# Patient Record
Sex: Female | Born: 2005 | Race: White | Hispanic: No | Marital: Single | Smoking: Never smoker
Health system: Southern US, Community
[De-identification: ages and names within clinical notes are randomized; demographics above are authoritative.]

## PROBLEM LIST (undated history)

## (undated) DIAGNOSIS — F909 Attention-deficit hyperactivity disorder, unspecified type: Secondary | ICD-10-CM

## (undated) DIAGNOSIS — H6690 Otitis media, unspecified, unspecified ear: Secondary | ICD-10-CM

## (undated) DIAGNOSIS — F3281 Premenstrual dysphoric disorder: Secondary | ICD-10-CM

## (undated) DIAGNOSIS — F329 Major depressive disorder, single episode, unspecified: Secondary | ICD-10-CM

## (undated) DIAGNOSIS — F32A Depression, unspecified: Secondary | ICD-10-CM

## (undated) DIAGNOSIS — R519 Headache, unspecified: Secondary | ICD-10-CM

## (undated) HISTORY — PX: TOE SURGERY: SHX1073

## (undated) HISTORY — PX: TYMPANOPLASTY: SHX33

## (undated) HISTORY — PX: OTHER SURGICAL HISTORY: SHX169

---
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
## 2024-02-22 ENCOUNTER — Encounter: Payer: Self-pay | Admitting: Nurse Practitioner

## 2024-02-22 ENCOUNTER — Ambulatory Visit (INDEPENDENT_AMBULATORY_CARE_PROVIDER_SITE_OTHER): Admitting: Nurse Practitioner

## 2024-02-22 VITALS — BP 117/69 | HR 81 | Temp 97.3°F | Ht 63.0 in | Wt 183.6 lb

## 2024-02-22 DIAGNOSIS — Z6832 Body mass index (BMI) 32.0-32.9, adult: Secondary | ICD-10-CM | POA: Diagnosis not present

## 2024-02-22 DIAGNOSIS — L918 Other hypertrophic disorders of the skin: Secondary | ICD-10-CM

## 2024-02-22 DIAGNOSIS — E66811 Obesity, class 1: Secondary | ICD-10-CM

## 2024-02-22 DIAGNOSIS — Z23 Encounter for immunization: Secondary | ICD-10-CM

## 2024-02-22 DIAGNOSIS — Z0001 Encounter for general adult medical examination with abnormal findings: Secondary | ICD-10-CM | POA: Insufficient documentation

## 2024-02-22 DIAGNOSIS — M79676 Pain in unspecified toe(s): Secondary | ICD-10-CM | POA: Diagnosis not present

## 2024-02-22 DIAGNOSIS — E6609 Other obesity due to excess calories: Secondary | ICD-10-CM | POA: Insufficient documentation

## 2024-02-22 LAB — BAYER DCA HB A1C WAIVED: HB A1C (BAYER DCA - WAIVED): 4.8 % (ref 4.8–5.6)

## 2024-02-22 NOTE — Addendum Note (Signed)
 Addended by: JODENE CORNERS B on: 02/22/2024 04:18 PM   Modules accepted: Orders

## 2024-02-23 ENCOUNTER — Ambulatory Visit: Payer: Self-pay | Admitting: Nurse Practitioner

## 2024-02-23 LAB — CBC WITH DIFFERENTIAL/PLATELET
Basophils Absolute: 0 x10E3/uL (ref 0.0–0.2)
Basos: 0 %
EOS (ABSOLUTE): 0.1 x10E3/uL (ref 0.0–0.4)
Eos: 1 %
Hematocrit: 42.2 % (ref 34.0–46.6)
Hemoglobin: 13.8 g/dL (ref 11.1–15.9)
Immature Grans (Abs): 0 x10E3/uL (ref 0.0–0.1)
Immature Granulocytes: 0 %
Lymphocytes Absolute: 2.1 x10E3/uL (ref 0.7–3.1)
Lymphs: 25 %
MCH: 27.6 pg (ref 26.6–33.0)
MCHC: 32.7 g/dL (ref 31.5–35.7)
MCV: 84 fL (ref 79–97)
Monocytes Absolute: 0.5 x10E3/uL (ref 0.1–0.9)
Monocytes: 5 %
Neutrophils Absolute: 5.9 x10E3/uL (ref 1.4–7.0)
Neutrophils: 69 %
Platelets: 354 x10E3/uL (ref 150–450)
RBC: 5 x10E6/uL (ref 3.77–5.28)
RDW: 13.7 % (ref 11.7–15.4)
WBC: 8.7 x10E3/uL (ref 3.4–10.8)

## 2024-02-23 LAB — LIPID PANEL
Chol/HDL Ratio: 5.4 ratio — ABNORMAL HIGH (ref 0.0–4.4)
Cholesterol, Total: 156 mg/dL (ref 100–169)
HDL: 29 mg/dL — ABNORMAL LOW (ref >39)
LDL Chol Calc (NIH): 98 mg/dL (ref 0–109)
Triglycerides: 166 mg/dL — ABNORMAL HIGH (ref 0–89)
VLDL Cholesterol Cal: 29 mg/dL (ref 5–40)

## 2024-02-23 LAB — CMP14+EGFR
ALT: 41 IU/L — ABNORMAL HIGH (ref 0–32)
AST: 21 IU/L (ref 0–40)
Albumin: 5 g/dL (ref 4.0–5.0)
Alkaline Phosphatase: 84 IU/L (ref 42–106)
BUN/Creatinine Ratio: 21 (ref 9–23)
BUN: 15 mg/dL (ref 6–20)
Bilirubin Total: 0.3 mg/dL (ref 0.0–1.2)
CO2: 22 mmol/L (ref 20–29)
Calcium: 10.2 mg/dL (ref 8.7–10.2)
Chloride: 105 mmol/L (ref 96–106)
Creatinine, Ser: 0.71 mg/dL (ref 0.57–1.00)
Globulin, Total: 2 g/dL (ref 1.5–4.5)
Glucose: 77 mg/dL (ref 70–99)
Potassium: 4.6 mmol/L (ref 3.5–5.2)
Sodium: 144 mmol/L (ref 134–144)
Total Protein: 7 g/dL (ref 6.0–8.5)
eGFR: 126 mL/min/1.73 (ref >59)

## 2024-02-23 LAB — THYROID PANEL WITH TSH
Free Thyroxine Index: 2.2 (ref 1.2–4.9)
T3 Uptake Ratio: 25 % (ref 24–39)
T4, Total: 8.8 ug/dL (ref 4.5–12.0)
TSH: 0.67 u[IU]/mL (ref 0.450–4.500)

## 2024-02-23 LAB — HIV ANTIBODY (ROUTINE TESTING W REFLEX): HIV Screen 4th Generation wRfx: NONREACTIVE

## 2024-03-21 ENCOUNTER — Emergency Department (HOSPITAL_BASED_OUTPATIENT_CLINIC_OR_DEPARTMENT_OTHER)
Admission: EM | Admit: 2024-03-21 | Discharge: 2024-03-21 | Disposition: A | Discharge status: Home / Self Care | Source: Home / Self Care | Attending: Emergency Medicine | Admitting: Emergency Medicine

## 2024-03-21 ENCOUNTER — Encounter (HOSPITAL_BASED_OUTPATIENT_CLINIC_OR_DEPARTMENT_OTHER): Payer: Self-pay | Admitting: *Deleted

## 2024-03-21 ENCOUNTER — Other Ambulatory Visit: Payer: Self-pay

## 2024-03-21 ENCOUNTER — Emergency Department (HOSPITAL_BASED_OUTPATIENT_CLINIC_OR_DEPARTMENT_OTHER)

## 2024-03-21 DIAGNOSIS — Z91040 Latex allergy status: Secondary | ICD-10-CM | POA: Insufficient documentation

## 2024-03-21 DIAGNOSIS — D72829 Elevated white blood cell count, unspecified: Secondary | ICD-10-CM | POA: Insufficient documentation

## 2024-03-21 DIAGNOSIS — N3001 Acute cystitis with hematuria: Secondary | ICD-10-CM | POA: Insufficient documentation

## 2024-03-21 DIAGNOSIS — R1031 Right lower quadrant pain: Secondary | ICD-10-CM

## 2024-03-21 LAB — URINALYSIS, ROUTINE W REFLEX MICROSCOPIC
Bacteria, UA: NONE SEEN
Bilirubin Urine: NEGATIVE
Glucose, UA: NEGATIVE mg/dL
Ketones, ur: 15 mg/dL — AB
Nitrite: NEGATIVE
Protein, ur: 30 mg/dL — AB
RBC / HPF: >50 RBC/hpf (ref 0–5)
Specific Gravity, Urine: 1.026 (ref 1.005–1.030)
WBC, UA: >50 WBC/hpf (ref 0–5)
pH: 6.5 (ref 5.0–8.0)

## 2024-03-21 LAB — LIPASE, BLOOD: Lipase: 19 U/L (ref 11–51)

## 2024-03-21 LAB — CBC
HCT: 43.3 % (ref 36.0–46.0)
Hemoglobin: 14.9 g/dL (ref 12.0–15.0)
MCH: 27.6 pg (ref 26.0–34.0)
MCHC: 34.4 g/dL (ref 30.0–36.0)
MCV: 80.3 fL (ref 80.0–100.0)
Platelets: 331 K/uL (ref 150–400)
RBC: 5.39 MIL/uL — ABNORMAL HIGH (ref 3.87–5.11)
RDW: 13.3 % (ref 11.5–15.5)
WBC: 11.2 K/uL — ABNORMAL HIGH (ref 4.0–10.5)
nRBC: 0 % (ref 0.0–0.2)

## 2024-03-21 LAB — COMPREHENSIVE METABOLIC PANEL WITH GFR
ALT: 42 U/L (ref 0–44)
AST: 22 U/L (ref 15–41)
Albumin: 5.1 g/dL — ABNORMAL HIGH (ref 3.5–5.0)
Alkaline Phosphatase: 94 U/L (ref 38–126)
Anion gap: 14 (ref 5–15)
BUN: 15 mg/dL (ref 6–20)
CO2: 24 mmol/L (ref 22–32)
Calcium: 10.3 mg/dL (ref 8.9–10.3)
Chloride: 101 mmol/L (ref 98–111)
Creatinine, Ser: 0.6 mg/dL (ref 0.44–1.00)
GFR, Estimated: >60 mL/min (ref >60)
Glucose, Bld: 85 mg/dL (ref 70–99)
Potassium: 3.9 mmol/L (ref 3.5–5.1)
Sodium: 139 mmol/L (ref 135–145)
Total Bilirubin: 0.4 mg/dL (ref 0.0–1.2)
Total Protein: 8 g/dL (ref 6.5–8.1)

## 2024-03-21 LAB — PREGNANCY, URINE: Preg Test, Ur: NEGATIVE

## 2024-03-21 LAB — OCCULT BLOOD X 1 CARD TO LAB, STOOL: Fecal Occult Bld: NEGATIVE

## 2024-03-21 MED ORDER — CEFPODOXIME PROXETIL 200 MG PO TABS: 200.0000 mg | ORAL_TABLET | Freq: Two times a day (BID) | ORAL | 0 refills | Status: AC

## 2024-03-21 MED ORDER — IOHEXOL 300 MG/ML  SOLN
100.0000 mL | Freq: Once | INTRAMUSCULAR | Status: AC | PRN
  Administered 2024-03-21: 20:00:00 85 mL via INTRAVENOUS

## 2024-03-21 MED ORDER — SODIUM CHLORIDE 0.9 % IV SOLN
1.0000 g | Freq: Once | INTRAVENOUS | Status: AC
  Administered 2024-03-21: 21:00:00 1 g via INTRAVENOUS
  Filled 2024-03-21: qty 10

## 2024-03-21 NOTE — Discharge Instructions (Signed)
 As we discussed, you did have a urinary tract infection based on labs.  All of your other labs and CT scan was normal.  I will prescribe you some antibiotics.  Please take as prescribed.  You can follow-up with your primary care doctor.  You can return to the emergency department for any worsening symptoms.

## 2024-03-21 NOTE — ED Provider Notes (Signed)
 [place] EMERGENCY DEPARTMENT AT [facility] Provider Note   CSN: [id number] Arrival date & time: 03/21/24  1859     Patient presents with: Abdominal Pain and Rectal Bleeding   Vanessa Flores is a 18 y.o. female patient who presents to the emergency department today for further evaluation of right lower quadrant abdominal pain and rectal bleeding that started yesterday.  Patient has had a total of 4 bloody bowel movements.  Abdominal pain started shortly after.  Primary localized to the right side the abdomen worse in the right lower quadrant.  Denies any nausea, vomiting, diarrhea, vaginal symptoms, fever, chills.    Abdominal Pain Associated symptoms: hematochezia   Rectal Bleeding Associated symptoms: abdominal pain        Prior to Admission medications  Medication Sig Start Date End Date Taking? Authorizing Provider  cefpodoxime  (VANTIN ) 200 MG tablet Take 1 tablet (200 mg total) by mouth 2 (two) times daily. 03/21/24  Yes Theotis, Alaycia Eardley M, PA-C  cloNIDine  HCl (KAPVAY ) 0.1 MG TB12 ER tablet Take 1 tablet (0.1 mg total) by mouth 2 (two) times daily in the am and at bedtime.. Patient not taking: Reported on 01/25/2019 07/25/17 01/26/19  Jonnalagadda, Janardhana, MD    Allergies: Adhesive [tape] and Latex    Review of Systems  Gastrointestinal:  Positive for abdominal pain and hematochezia.  All other systems reviewed and are negative.   Updated Vital Signs BP (!) 136/96 (BP Location: Right Arm)   Pulse 85   Temp 98.6 F (37 C)   Resp 19   Ht 5' 3 (1.6 m)   Wt 83 kg   LMP 02/22/2024   SpO2 100%   BMI 32.41 kg/m   Physical Exam Vitals and nursing note reviewed. Exam conducted with a chaperone present.  Constitutional:      General: She is not in acute distress.    Appearance: Normal appearance.  HENT:     Head: Normocephalic and atraumatic.  Eyes:     General:        Right eye: No discharge.        Left eye: No discharge.  Cardiovascular:      Comments: Regular rate and rhythm.  S1/S2 are distinct without any evidence of murmur, rubs, or gallops.  Radial pulses are 2+ bilaterally.  Dorsalis pedis pulses are 2+ bilaterally.  No evidence of pedal edema. Pulmonary:     Comments: Clear to auscultation bilaterally.  Normal effort.  No respiratory distress.  No evidence of wheezes, rales, or rhonchi heard throughout. Abdominal:     General: Abdomen is flat. Bowel sounds are normal. There is no distension.     Tenderness: There is no guarding or rebound.     Comments: There is tenderness to the right side of the abdomen worse in the right lower quadrant in comparison to the right upper quadrant.  No guarding or rebound.  No peritoneal signs.  Genitourinary:    Comments: No evidence of external hemorrhoids on visual inspection.  Digital rectal exam showed good rectal tone.  No evidence of anal fissure or internal hemorrhoids.  Scant stool that was brown in the rectal vault. Musculoskeletal:        General: Normal range of motion.     Cervical back: Neck supple.  Skin:    General: Skin is warm and dry.     Findings: No rash.  Neurological:     General: No focal deficit present.     Mental Status: She is alert.  Psychiatric:        Mood and Affect: Mood normal.        Behavior: Behavior normal.     (all labs ordered are listed, but only abnormal results are displayed) Labs Reviewed  COMPREHENSIVE METABOLIC PANEL WITH GFR - Abnormal; Notable for the following components:      Result Value   Albumin 5.1 (*)    All other components within normal limits  CBC - Abnormal; Notable for the following components:   WBC 11.2 (*)    RBC 5.39 (*)    All other components within normal limits  URINALYSIS, ROUTINE W REFLEX MICROSCOPIC - Abnormal; Notable for the following components:   Color, Urine BROWN (*)    APPearance HAZY (*)    Hgb urine dipstick LARGE (*)    Ketones, ur 15 (*)    Protein, ur 30 (*)    Leukocytes,Ua MODERATE (*)     All other components within normal limits  LIPASE, BLOOD  PREGNANCY, URINE  OCCULT BLOOD X 1 CARD TO LAB, STOOL    EKG: None  Radiology: CT ABDOMEN PELVIS W CONTRAST Result Date: 03/21/2024 EXAM: CT ABDOMEN AND PELVIS WITH CONTRAST 03/21/2024 08:34:18 PM TECHNIQUE: CT of the abdomen and pelvis was performed with the administration of 85 mL of iohexol  (OMNIPAQUE ) 300 MG/ML solution. Multiplanar reformatted images are provided for review. Automated exposure control, iterative reconstruction, and/or weight-based adjustment of the mA/kV was utilized to reduce the radiation dose to as low as reasonably achievable. COMPARISON: CT abdomen and pelvis 08/02/2022. CLINICAL HISTORY: RLQ abdominal pain. FINDINGS: LOWER CHEST: No acute abnormality. LIVER: The liver is unremarkable. GALLBLADDER AND BILE DUCTS: The gallbladder is surgically absent. No biliary ductal dilatation. SPLEEN: No acute abnormality. PANCREAS: No acute abnormality. ADRENAL GLANDS: No acute abnormality. KIDNEYS, URETERS AND BLADDER: No stones in the kidneys or ureters. No hydronephrosis. No perinephric or periureteral stranding. Urinary bladder is unremarkable. GI AND BOWEL: Stomach demonstrates no acute abnormality. The appendix appears within normal limits. The right ovary abuts the cecum. There is no bowel obstruction. PERITONEUM AND RETROPERITONEUM: No ascites. No free air. VASCULATURE: Aorta is normal in caliber. LYMPH NODES: No lymphadenopathy. REPRODUCTIVE ORGANS: There is an IUD in the uterus which appears slightly oblique and low lying. The bilateral ovaries are visualized. There is a 2.4 cm cyst or dominant follicle in the right ovary. The right ovary abuts the cecum. BONES AND SOFT TISSUES: No acute osseous abnormality. No focal soft tissue abnormality. IMPRESSION: 1. No acute findings in the abdomen or pelvis. 2. Low-lying, slightly oblique intrauterine device. Consider follow up nonemergent pelvic ultrasound. 3. 2.4 cm  simple-appearing right ovarian cyst/dominant follicle, no follow-up imaging recommended. Electronically signed by: Greig Pique MD 03/21/2024 08:40 PM EST RP Workstation: [id number]     Procedures   Medications Ordered in the ED  iohexol  (OMNIPAQUE ) 300 MG/ML solution 100 mL (85 mLs Intravenous Contrast Given 03/21/24 2029)  cefTRIAXone  (ROCEPHIN ) 1 g in sodium chloride  0.9 % 100 mL IVPB (1 g Intravenous New Bag/Given 03/21/24 2056)    Clinical Course as of 03/21/24 2227  Wed Mar 21, 2024  2200 On repeat evaluation, I spoke with the patient, her father, and mother on the phone.  We went over all the labs and imaging at the bedside.  We discussed that her hemoglobin was normal and she would not need blood despite having some rectal bleeding.  We talked about her urinalysis which did show evidence of urinary tract infection and this is why she  received 1 g of ceftriaxone .  All of the rest of her labs were all reassuring.  We discussed the CT scan how this was normal.  She did have evidence of a ovarian cyst which we also discussed.  Patient was stating that she was having some referred pain into the right flank and given the findings of the urinalysis this could be the cause.  Digital rectal exam which was performed with nurse at bedside as chaperone.  This was documented in physical exam. [CF]  2217 Occult blood card to lab, stool Negative. [CF]  2217 CT ABDOMEN PELVIS W CONTRAST No clinical signs of acute abdomen.  I do agree with the radiologist interpretation. [CF]  2217 Pregnancy, urine Negative. [CF]  2217 Urinalysis, Routine w reflex microscopic -Urine, Clean Catch(!) Positive for urinary tract infection in the setting of referred right flank pain could be the cause. [CF]  2217 CBC(!) Leukocytosis. [CF]  2217 Comprehensive metabolic panel(!) Negative. [CF]  2218 Lipase, blood Negative. [CF]    Clinical Course User Index [CF] Theotis Cameron HERO, PA-C    Medical Decision  Making This patient presents to the ED for concern of right lower quad abdominal pain, this involves an extensive number of treatment options, and is a complaint that carries with it a high risk of complications and morbidity.  The differential diagnosis includes ovarian cyst, pyelonephritis, referred pain from cystitis, appendicitis, kidney stone, colitis, vascular compromise.   Co morbidities that complicate the patient evaluation  Anxiety   Additional history obtained:  Additional history and/or information obtained from chart review, notable for previous ED visits   Lab Tests:  I Ordered, and personally interpreted labs.  The pertinent results include: See ED clinical course   Imaging Studies ordered:  I ordered imaging studies including CT abdomen pelvis with contrast I independently visualized and interpreted imaging which showed no acute findings did show ovarian cysts.  Patient notified I agree with the radiologist interpretation   Cardiac Monitoring:  The patient was maintained on a cardiac monitor.  I personally viewed and interpreted the cardiac monitored which showed an underlying rhythm of: Normal sinus rhythm   Medicines ordered and prescription drug management:  I ordered medication including ceftriaxone  for urinary tract infection Reevaluation of the patient after these medicines showed that the patient improved I have reviewed the patients home medicines and have made adjustments as needed   Test Considered:  None   Critical Interventions:  Antibiotics     Problem List / ED Course:  See ED clinical course   Reevaluation:  After the interventions noted above, I reevaluated the patient and found that they have :improved   Social Determinants of Health:  Health literacy   Disposition:  After consideration of the diagnostic results and the patients response to treatment, I feel that the patient would benefit from discharge home with  antibiotics.  PCP follow-up.  Strict turn precaution were discussed.  She is safe for discharge.   Amount and/or Complexity of Data Reviewed Labs: ordered. Decision-making details documented in ED Course. Radiology: ordered. Decision-making details documented in ED Course.  Risk Prescription drug management.     Final diagnoses:  Right lower quadrant abdominal pain  Acute cystitis with hematuria    ED Discharge Orders          Ordered    cefpodoxime  (VANTIN ) 200 MG tablet  2 times daily        03/21/24 2218  Theotis Peers [place], PA-C 03/21/24 2227    Charlyn Sora, MD 03/22/24 [phone]

## 2024-03-21 NOTE — ED Triage Notes (Signed)
 Pt states that she had blood in her stool (x1 yesterday and x3 today).  Pt has had nausea with this and right sided abdominal pain. Pt was seen at [facility] and had rectal exam but no blood work and was told to some to ED for abdominal pain work up and CT scan.

## 2024-04-19 ENCOUNTER — Encounter: Payer: Self-pay | Admitting: *Deleted

## 2024-05-02 ENCOUNTER — Ambulatory Visit: Payer: Self-pay | Admitting: Family Medicine

## 2024-11-05 ENCOUNTER — Ambulatory Visit: Admitting: Physician Assistant

## 2025-02-25 ENCOUNTER — Encounter: Admitting: Family

## 2025-02-25 ENCOUNTER — Encounter: Admitting: Nurse Practitioner
